# Patient Record
Sex: Female | Born: 1955 | Race: Black or African American | Hispanic: No | Marital: Single | Smoking: Never smoker
Health system: Southern US, Community
[De-identification: ages and names within clinical notes are randomized; demographics above are authoritative.]

## PROBLEM LIST (undated history)

## (undated) DIAGNOSIS — K449 Diaphragmatic hernia without obstruction or gangrene: Secondary | ICD-10-CM

## (undated) DIAGNOSIS — Z9989 Dependence on other enabling machines and devices: Secondary | ICD-10-CM

## (undated) DIAGNOSIS — K219 Gastro-esophageal reflux disease without esophagitis: Secondary | ICD-10-CM

## (undated) DIAGNOSIS — F1921 Other psychoactive substance dependence, in remission: Secondary | ICD-10-CM

## (undated) DIAGNOSIS — K635 Polyp of colon: Secondary | ICD-10-CM

## (undated) DIAGNOSIS — E039 Hypothyroidism, unspecified: Secondary | ICD-10-CM

## (undated) DIAGNOSIS — M47816 Spondylosis without myelopathy or radiculopathy, lumbar region: Secondary | ICD-10-CM

## (undated) DIAGNOSIS — G459 Transient cerebral ischemic attack, unspecified: Secondary | ICD-10-CM

## (undated) DIAGNOSIS — I10 Essential (primary) hypertension: Secondary | ICD-10-CM

## (undated) DIAGNOSIS — E7800 Pure hypercholesterolemia, unspecified: Secondary | ICD-10-CM

## (undated) DIAGNOSIS — Z8673 Personal history of transient ischemic attack (TIA), and cerebral infarction without residual deficits: Secondary | ICD-10-CM

## (undated) DIAGNOSIS — F1991 Other psychoactive substance use, unspecified, in remission: Secondary | ICD-10-CM

## (undated) DIAGNOSIS — G4733 Obstructive sleep apnea (adult) (pediatric): Secondary | ICD-10-CM

## (undated) DIAGNOSIS — Z87898 Personal history of other specified conditions: Secondary | ICD-10-CM

## (undated) HISTORY — PX: HIATAL HERNIA REPAIR: SHX195

## (undated) HISTORY — DX: Personal history of transient ischemic attack (TIA), and cerebral infarction without residual deficits: Z86.73

## (undated) HISTORY — DX: Spondylosis without myelopathy or radiculopathy, lumbar region: M47.816

## (undated) HISTORY — DX: Other psychoactive substance dependence, in remission: F19.21

## (undated) HISTORY — DX: Other psychoactive substance use, unspecified, in remission: F19.91

## (undated) HISTORY — DX: Essential (primary) hypertension: I10

## (undated) HISTORY — DX: Polyp of colon: K63.5

## (undated) HISTORY — DX: Diaphragmatic hernia without obstruction or gangrene: K44.9

## (undated) HISTORY — DX: Transient cerebral ischemic attack, unspecified: G45.9

## (undated) HISTORY — DX: Hypothyroidism, unspecified: E03.9

## (undated) HISTORY — DX: Personal history of other specified conditions: Z87.898

## (undated) HISTORY — DX: Obstructive sleep apnea (adult) (pediatric): G47.33

## (undated) HISTORY — DX: Dependence on other enabling machines and devices: Z99.89

## (undated) HISTORY — PX: CATARACT EXTRACTION: SUR2

## (undated) HISTORY — DX: Pure hypercholesterolemia, unspecified: E78.00

## (undated) HISTORY — DX: Gastro-esophageal reflux disease without esophagitis: K21.9

## (undated) HISTORY — PX: MINOR CARPAL TUNNEL: SHX6472

---

## 2007-03-16 ENCOUNTER — Ambulatory Visit: Payer: Self-pay | Admitting: Cardiology

## 2007-03-16 ENCOUNTER — Inpatient Hospital Stay (HOSPITAL_COMMUNITY): Admission: EM | Admit: 2007-03-16 | Discharge: 2007-03-17 | Payer: Self-pay | Admitting: Emergency Medicine

## 2007-03-26 ENCOUNTER — Ambulatory Visit: Payer: Self-pay | Admitting: Cardiology

## 2010-07-20 NOTE — H&P (Signed)
Erin Ferguson, CALLARI NO.:  [id number]   MEDICAL RECORD NO.:  [id number]          PATIENT TYPE:  EMS   LOCATION:  MAJO                         FACILITY:  [facility]   PHYSICIAN:  Reginia Forts, MD     DATE OF BIRTH:  1955/07/22   DATE OF ADMISSION:  03/16/2007  DATE OF DISCHARGE:                              HISTORY & PHYSICAL   ATTENDING PHYSICIAN:  Jonelle Sidle, M.D. with [facility].   CHIEF COMPLAINT:  Chest pain.   HISTORY OF PRESENT ILLNESS:  Erin Ferguson is a 55 year old Caucasian woman  with a history of atypical chest pain who presents with an episode of  substernal chest pressure at work today.  Historically, the patient  first had chest pain in 2007.  She was admitted to [facility] and  was ruled out for a myocardial infarction.  Cardiac cath demonstrated no  significant disease.  In addition, a GI workup also was negative.  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  In early  October, she developed substernal chest pressure with a band-like  radiation around the chest and back.  Her primary care physician started  her on muscle relaxants which did not relieve the symptoms.  She did  notice that when she was on temporary leave from her work, she had no  recurrence of symptoms.  She also denies any exertional component to her  chest pain.  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  She states that she had difficulty  keeping up with the production line.  Upon discontinuing her work, she  noted improvement in her symptoms.  She denies any orthopnea, paroxysmal  nocturnal dyspnea, or syncope.  She denies any dyspnea on exertion.  In  the emergency room, the patient was started on IV heparin, aspirin, and  nitroglycerin.  She currently  rates her pain as a 1 out of 10.   PAST MEDICAL HISTORY:  1. Obstructive sleep apnea on CPAP.  2. History of chest pain with cardiac cath in  2007, which was      reportedly normal.  3. Stable lung nodule.  4. Plantar fascitis.   PAST SURGICAL HISTORY:  Hysterectomy in 2005.   ALLERGIES:  No known drug allergies.   MEDICATIONS:  Naproxen 500 mg p.o. b.i.d. for plantar fascitis.   SOCIAL HISTORY:  The patient lives in [place].  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   REVIEW OF SYSTEMS:  Notable for occasional nosebleed usually upon  bearing down or with significantly dry air.  All the rest of 12 systems  were reviewed.   PHYSICAL EXAMINATION:  VITAL SIGNS:  Temperature is 97.2, pulse 73,  respiratory rate  16, blood pressure 159/74.  GENERAL:  The patient is awake, alert, oriented x3 in no acute distress.  HEENT:  Normocephalic atraumatic.  Pupils equal and round, reactive to  light.  Extraocular movements are intact.  Nasal septum appears mildly  inverted.  NECK:  Shows no JVD.  CARDIOVASCULAR:  Regular rhythm, normal rate.  No murmurs, rubs, or  gallops.  LUNGS:  Clear to auscultation bilaterally.  ABDOMEN:  Obese, positive bowel sounds, soft, nontender, nondistended.  EXTREMITIES:  Show no cyanosis, clubbing, or edema.  MUSCULOSKELETAL:  Demonstrate no joint deformities or effusions.  NEUROLOGIC:  Cranial nerves II-XII grossly intact.  No focal motor or  sensory deficits.  SKIN:  Demonstrates no rashes or lesions.  PSYCH:  Demonstrates normal affect.   X-RAY:  Chest x-ray demonstrates mild cardiomegaly and a dilated left  atrium with a mild right lower lobe atelectasis.   EKG is a normal sinus rhythm with a heart rate of 68, normal axis and  nonspecific ST-T wave changes.    LABORATORY:  Demonstrate a hemoglobin of 13.9.  BUN 20, creatinine 1.2.  Troponin is less than 0.05.  CK is 50.3.  MB is 1.2.   ASSESSMENT:  This is a 55 year old Caucasiancasian woman with atypical chest  pain which may be related to occupational stress.   PLAN:  1. Acute coronary syndrome.  The patient will be ruled out for      myocardial infarction.  We will continue intravenous heparin that      was started in the emergency room.  I anticipate a stress test if      the patient rules out.  2. Hypertension.  Blood pressure appears to be improving in the      emergency room.  We will continue to monitor.  Beta-blocker will be      held until a urine drug screen has been obtained.  Pending those      results, a beta-blocker may be added during the hospital course.      In the interim, the patient will be started on an ACE inhibitor.  3. Gastrointestinal.  The patient will be started on Protonix for a      possible gastrointestinal cause of her symptoms.      Reginia Forts, MD  Electronically Signed     RA/MEDQ  D:  03/16/2007  T:  03/16/2007  Job:  [id number]

## 2010-07-20 NOTE — Discharge Summary (Signed)
Erin Ferguson, Erin NO.:  [id number]   MEDICAL RECORD NO.:  [id number]          PATIENT TYPE:  INP   LOCATION:  3733                         FACILITY:  [facility]   PHYSICIAN:  Duke Salvia, MD, FACCDATE OF BIRTH:  Mar 22, 1955   DATE OF ADMISSION:  03/16/2007  DATE OF DISCHARGE:  03/17/2007                               DISCHARGE SUMMARY   PROCEDURE:  None.   PRIMARY FINAL DISCHARGE DIAGNOSIS:  1. Chest pain, cardiac enzymes negative for myocardial infarction and      follow up with cardiology as needed.   SECONDARY DIAGNOSES:  1. Obstructive sleep apnea on continuous positive airway pressure.  2. History of chest pain evaluation in 2007 reportedly by cardiac      catheterization which was normal.  3. History of lung nodule which was stable.  4. Plantar fasciitis.  5. Status post hysterectomy.  6. Remote history of drug use.  7. Reported history of increased stress at work and some anxiety      secondary to this.  8. Borderline hypertension with the patient reporting outpatient blood      pressures generally greater than or equal to 130, with blood      pressure of 141/88 on admission.  9. Dyslipidemia with a total cholesterol 185, triglycerides 44, HDL      38, LDL 138.  Dietary changes encouraged.   TIME SPENT AT DISCHARGE:  38 minutes.   HOSPITAL COURSE:  Erin Ferguson is a 55 year old female with no previous  history of coronary artery disease.  She had been evaluated by cath in  the past which was normal at [facility].  A GI workup at that time  was also negative.  She developed recurrent chest pain and came to the  emergency room where she was admitted for further evaluation.   Her cardiac enzymes were negative for MI.  Her pain could be brought on  by particular arm movements, and she stated it was associated with  stress.  She stated that she did not have any change in her pain with  the Protonix.   On March 17, 2007, Erin Ferguson was evaluated  by Dr. Graciela Husbands.  He felt that  her symptoms were atypical and she could be safely evaluated as an  outpatient by her family physician.  She is therefore discharged home on  March 17, 2007 and is to follow up with Dr. Archer Asa at the [facility].   DISCHARGE INSTRUCTIONS:  Her activity level is to be increased  gradually.  She is encouraged to stick to a low-sodium heart-healthy  diet.  She is to follow up with Dr. Diona Browner as needed.  She is to  follow up with the [facility] with Dr.  Archer Asa as soon as possible.   DISCHARGE MEDICATIONS:  Naproxen 500 mg b.i.d.      Theodore Demark, PA-C      Duke Salvia, MD, [facility]  Electronically Signed    RB/MEDQ  D:  03/17/2007  T:  03/18/2007  Job:  [id number]   cc:   Molero, M.D.

## 2010-11-25 LAB — I-STAT 8, (EC8 V) (CONVERTED LAB)
Chloride: 111
Glucose, Bld: 102 — ABNORMAL HIGH
HCT: 41
Potassium: 3.5
Sodium: 141
TCO2: 25

## 2010-11-25 LAB — POCT CARDIAC MARKERS
Myoglobin, poc: 50.3
Operator id: 234501
Troponin i, poc: <0.05

## 2010-11-25 LAB — COMPREHENSIVE METABOLIC PANEL
ALT: 17
AST: 17
Albumin: 3.4 — ABNORMAL LOW
Alkaline Phosphatase: 69
Potassium: 3.5
Sodium: 139
Total Protein: 6.3

## 2010-11-25 LAB — CBC
Hemoglobin: 13.1
RBC: 4.39

## 2010-11-25 LAB — LIPID PANEL
Total CHOL/HDL Ratio: 4.9
VLDL: 9

## 2010-11-25 LAB — TROPONIN I

## 2010-11-25 LAB — CARDIAC PANEL(CRET KIN+CKTOT+MB+TROPI): CK, MB: 1.6

## 2010-11-25 LAB — HEPARIN LEVEL (UNFRACTIONATED)

## 2010-11-25 LAB — POCT I-STAT CREATININE: Operator id: 234501

## 2013-08-17 DIAGNOSIS — I10 Essential (primary) hypertension: Secondary | ICD-10-CM | POA: Insufficient documentation

## 2013-08-17 DIAGNOSIS — E079 Disorder of thyroid, unspecified: Secondary | ICD-10-CM | POA: Insufficient documentation

## 2013-08-17 HISTORY — DX: Essential (primary) hypertension: I10

## 2014-10-20 DIAGNOSIS — G459 Transient cerebral ischemic attack, unspecified: Secondary | ICD-10-CM

## 2014-10-20 HISTORY — DX: Transient cerebral ischemic attack, unspecified: G45.9

## 2015-09-29 DIAGNOSIS — M47816 Spondylosis without myelopathy or radiculopathy, lumbar region: Secondary | ICD-10-CM

## 2015-09-29 HISTORY — DX: Spondylosis without myelopathy or radiculopathy, lumbar region: M47.816

## 2016-05-09 DIAGNOSIS — R079 Chest pain, unspecified: Secondary | ICD-10-CM | POA: Insufficient documentation

## 2016-05-09 DIAGNOSIS — K219 Gastro-esophageal reflux disease without esophagitis: Secondary | ICD-10-CM | POA: Insufficient documentation

## 2016-05-09 HISTORY — DX: Gastro-esophageal reflux disease without esophagitis: K21.9

## 2018-03-09 LAB — CBC AND DIFFERENTIAL
HCT: 44 (ref 36–46)
Hemoglobin: 14.8 (ref 12.0–16.0)
Platelets: 243 (ref 150–399)
WBC: 5.9

## 2018-03-30 ENCOUNTER — Encounter: Payer: Self-pay | Admitting: Osteopathic Medicine

## 2018-03-30 ENCOUNTER — Ambulatory Visit (INDEPENDENT_AMBULATORY_CARE_PROVIDER_SITE_OTHER): Payer: 59 | Admitting: Osteopathic Medicine

## 2018-03-30 VITALS — BP 139/69 | HR 77 | Temp 98.3°F | Ht 63.0 in | Wt 219.1 lb

## 2018-03-30 DIAGNOSIS — N2889 Other specified disorders of kidney and ureter: Secondary | ICD-10-CM | POA: Diagnosis not present

## 2018-03-30 DIAGNOSIS — G459 Transient cerebral ischemic attack, unspecified: Secondary | ICD-10-CM

## 2018-03-30 DIAGNOSIS — K219 Gastro-esophageal reflux disease without esophagitis: Secondary | ICD-10-CM

## 2018-03-30 DIAGNOSIS — G4733 Obstructive sleep apnea (adult) (pediatric): Secondary | ICD-10-CM

## 2018-03-30 DIAGNOSIS — E039 Hypothyroidism, unspecified: Secondary | ICD-10-CM

## 2018-03-30 DIAGNOSIS — I10 Essential (primary) hypertension: Secondary | ICD-10-CM | POA: Diagnosis not present

## 2018-03-30 DIAGNOSIS — K449 Diaphragmatic hernia without obstruction or gangrene: Secondary | ICD-10-CM

## 2018-03-30 DIAGNOSIS — Z9989 Dependence on other enabling machines and devices: Secondary | ICD-10-CM

## 2018-03-30 HISTORY — DX: Obstructive sleep apnea (adult) (pediatric): G47.33

## 2018-03-30 HISTORY — DX: Hypothyroidism, unspecified: E03.9

## 2018-03-30 HISTORY — DX: Diaphragmatic hernia without obstruction or gangrene: K44.9

## 2018-03-30 NOTE — Progress Notes (Signed)
HPI: Erin Ferguson is a 63 y.o. 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  [facility], 03/30/18,  for chief complaint of: New to establish care  Renal cyst  [place] [facility] [place] like to establish with a primary doctor outside of the system, this is what brings her to the office today.  Only concern is incidental finding of renal cyst on recent MRI  MRI of lumbar spine 02/20/2018 "Incidental findings: Partially visualized mildly complex left renal cyst with septation measures 6.4 cm"  Otherwise[facility] due to dyspnea on exertion and otherwise negative work-up except for abnormal stress test, no records are currently available  History of sleep apnea, compliant with and benefiting from CPAP.   History of hypertension: Controlled on hydrochlorothiazide  History of hypothyroidism, controlled on levothyroxine, not sure when last labs were checked.  History of GERD/hiatal hernia, recently changed to omeprazole from ranitidine, doing well on this.  History of TIA, on statin and low-dose aspirin.    Past medical, surgical, social and family history reviewed:  Patient Active Problem List   Diagnosis Date Noted  . Hypothyroidism 03/30/2018  . Hiatal hernia 03/30/2018  . OSA on CPAP 03/30/2018  . Chest pain 05/09/2016  . GERD (gastroesophageal reflux disease) 05/09/2016  . Spondylosis of lumbar region  without myelopathy or radiculopathy 09/29/2015  . TIA (transient ischemic attack) 10/20/2014  . Hypertension 08/17/2013    Past Surgical History:  Procedure Laterality Date  . CATARACT EXTRACTION    . HIATAL HERNIA REPAIR    . MINOR CARPAL TUNNEL      Social History   Tobacco Use  . Smoking status: Never Smoker  . Smokeless tobacco: Former Engineer, waterUser  Substance Use Topics  . Alcohol use: Yes    Comment: Once in a while    Family History  Problem Relation Age of Onset  . Heart disease Mother        hx rheumatic fever       Current medication list and allergy/intolerance information reviewed:    Current Outpatient Medications  Medication Sig Dispense Refill  . aspirin EC 81 MG tablet Take by mouth.    Marland Kitchen. atorvastatin (LIPITOR) 80 MG tablet Take by mouth.    . cetirizine (ZYRTEC) 10 MG tablet Take by mouth.    . cholecalciferol (VITAMIN D) 25 MCG (1000 UT) tablet Take by mouth.    . cyanocobalamin ([employer]S VITAMIN B12) 1000 MCG tablet Take by mouth.    . Flaxseed, Linseed, (FLAXSEED OIL) 1000 MG CAPS Take by mouth.    . hydrochlorothiazide (HYDRODIURIL) 25 MG tablet Take by mouth.    . levothyroxine (SYNTHROID, LEVOTHROID) 88 MCG tablet Take by mouth.    . Multiple Vitamins-Minerals (MULTIVITAMIN & MINERAL PO) Take by mouth.    . naproxen (NAPROSYN) 500 MG tablet Take by mouth.    Marland Kitchen. omeprazole (PRILOSEC) 10 MG capsule Take by mouth.    . ranitidine (ZANTAC) 150 MG tablet Take by mouth.     No current facility-administered medications for this visit.  Allergies  Allergen Reactions  . Sulfa Antibiotics Nausea Only and Nausea And Vomiting    Reports nausea, arm weakness and mood changes      Review of Systems:  Constitutional:  No  fever, no chills, No recent illness, +unintentional weight Gain. +significant fatigue.   HEENT: No  headache, no vision change, no hearing change, No sore throat, No  sinus pressure  Cardiac: No  chest pain, No  pressure, No palpitations,  No  Orthopnea  Respiratory:  +shortness of breath. No  Cough  Gastrointestinal: No  abdominal pain, No  nausea, No  vomiting,  No  blood in stool, No  diarrhea, No  constipation   Musculoskeletal: +myalgia/arthralgia  Skin: No  Rash, No other wounds/concerning lesions  Genitourinary: No  incontinence, No  abnormal genital bleeding, No abnormal genital discharge  Hem/Onc: No  easy bruising/bleeding, No  abnormal lymph node  Endocrine: No cold intolerance,  No heat intolerance. No polyuria/polydipsia/polyphagia   Neurologic: No  weakness, No  dizziness, No  slurred speech/focal weakness/facial droop  Psychiatric: No  concerns with depression, No  concerns with anxiety, +sleep problems, No mood problems  Exam:  BP 139/69 (BP Location: Left Arm, Patient Position: Sitting, Cuff Size: Large)   Pulse 77   Temp 98.3 F (36.8 C) (Oral)   Ht 5\' 3"  (1.6 m)   Wt 219 lb 1.6 oz (99.4 kg)   BMI 38.81 kg/m   Constitutional: VS see above. General Appearance: alert, well-developed, well-nourished, NAD  Eyes: Normal lids and conjunctive, non-icteric sclera  Ears, Nose, Mouth, Throat: MMM, Normal external inspection ears/nares/mouth/lips/gums. TM normal bilaterally. Pharynx/tonsils no erythema, no exudate. Nasal mucosa normal.   Neck: No masses, trachea midline. No thyroid enlargement. No tenderness/mass appreciated. No lymphadenopathy  Respiratory: Normal respiratory effort. no wheeze, no rhonchi, no rales  Cardiovascular: S1/S2 normal, no murmur, no rub/gallop auscultated. RRR. No lower extremity edema.   Gastrointestinal: Nontender, no masses. No hepatomegaly, no splenomegaly. No hernia appreciated. Bowel sounds normal. Rectal exam deferred.   Musculoskeletal: Gait normal. No clubbing/cyanosis of digits.   Neurological: Normal balance/coordination. No tremor. No cranial nerve deficit on limited exam.   Skin: warm, dry, intact. No rash/ulcer.   Psychiatric: Normal judgment/insight.  Normal mood and affect. Oriented x3.         ASSESSMENT/PLAN: The primary encounter diagnosis was Essential hypertension. Diagnoses of Other specified disorders of kidney and ureter, TIA (transient ischemic attack), Gastroesophageal reflux disease, esophagitis presence not specified, Hypothyroidism, unspecified type, Hiatal hernia, and OSA on CPAP were also pertinent to this visit.    Orders Placed This Encounter  Procedures  . CT RENAL ABD W/WO  . BASIC METABOLIC PANEL WITH GFR      Patient Instructions  For renal mass: We will get some blood work today to confirm normal kidney function, and assuming this is normal we will get you set up for a CT scan of the kidneys with IV contrast.  Once this test is done and the radiologist has given their report, we will have a better idea of next steps, whether this is something we need to monitor or whether there is some intervention needed such as biopsy or surgery.  Anytime you go to the [place] for care, please let them know to send records to my office.  Our fax number is [phone].  You are at the [place] later today, please have them send at least your most recent lab results and any other relevant notes.  Visit summary with medication list and pertinent instructions was printed for patient to review. All questions at time of visit were answered - patient instructed to contact office with any additional concerns or updates. ER/RTC precautions were reviewed with the patient.    Please note: voice recognition software was used to produce this document, and typos may escape review. Please contact Dr. Lyn Hollingshead for any needed clarifications.     Follow-up plan: Return for recheck depending on CT results .

## 2018-03-30 NOTE — Patient Instructions (Signed)
For renal mass: We will get some blood work today to confirm normal kidney function, and assuming this is normal we will get you set up for a CT scan of the kidneys with IV contrast.  Once this test is done and the radiologist has given their report, we will have a better idea of next steps, whether this is something we need to monitor or whether there is some intervention needed such as biopsy or surgery.  Anytime you go to the [place] for care, please let them know to send records to my office.  Our fax number is [phone].  You are at the [place] later today, please have them send at least your most recent lab results and any other relevant notes.

## 2018-04-09 ENCOUNTER — Telehealth: Payer: Self-pay | Admitting: Osteopathic Medicine

## 2018-04-09 DIAGNOSIS — I10 Essential (primary) hypertension: Secondary | ICD-10-CM

## 2018-04-09 NOTE — Addendum Note (Signed)
Addended by: Sandrea Matte on: 04/09/2018 04:22 PM   Modules accepted: Orders

## 2018-04-09 NOTE — Addendum Note (Signed)
Addended by: Sandrea MatteERSKINE, Jazon Jipson R on: 04/09/2018 04:21 PM   Modules accepted: Orders

## 2018-04-09 NOTE — Telephone Encounter (Signed)
Patient has order - [facility] so she needs to get the LAB RESULTS or just repeat the blood work here.

## 2018-04-09 NOTE — Telephone Encounter (Signed)
Pt reports having labs done recently, CMP required for imaging. Routing.

## 2018-04-09 NOTE — Telephone Encounter (Signed)
New STAT lab order placed. Pt will have to get labs redone. She has been advised.

## 2018-04-13 ENCOUNTER — Other Ambulatory Visit: Payer: 59

## 2018-04-20 ENCOUNTER — Other Ambulatory Visit: Payer: 59

## 2018-04-27 ENCOUNTER — Telehealth: Payer: Self-pay | Admitting: Osteopathic Medicine

## 2018-04-27 ENCOUNTER — Ambulatory Visit (INDEPENDENT_AMBULATORY_CARE_PROVIDER_SITE_OTHER): Payer: 59

## 2018-04-27 DIAGNOSIS — I700 Atherosclerosis of aorta: Secondary | ICD-10-CM | POA: Diagnosis not present

## 2018-04-27 DIAGNOSIS — N2889 Other specified disorders of kidney and ureter: Secondary | ICD-10-CM | POA: Diagnosis not present

## 2018-04-27 MED ORDER — IOPAMIDOL (ISOVUE-370) INJECTION 76%
100.0000 mL | Freq: Once | INTRAVENOUS | Status: AC | PRN
  Administered 2018-04-27: 100 mL via INTRAVENOUS

## 2018-04-27 NOTE — Telephone Encounter (Signed)
I had the front staff([place]) have the patient sign a records release so we can get complete records to review for this patient.

## 2018-04-27 NOTE — Telephone Encounter (Signed)
Pt stopped by inquiring about records I got some from the [place] but just old imaging results, nothing comprehensive

## 2018-04-30 LAB — CBC AND DIFFERENTIAL
HCT: 44 (ref 36–46)
Hemoglobin: 14.8 (ref 12.0–16.0)
Platelets: 243 (ref 150–399)
WBC: 5.9

## 2018-06-06 ENCOUNTER — Encounter: Payer: Self-pay | Admitting: Osteopathic Medicine

## 2018-06-06 DIAGNOSIS — R911 Solitary pulmonary nodule: Secondary | ICD-10-CM | POA: Insufficient documentation

## 2018-06-21 ENCOUNTER — Other Ambulatory Visit: Payer: Self-pay | Admitting: Osteopathic Medicine

## 2018-06-21 ENCOUNTER — Encounter: Payer: Self-pay | Admitting: Osteopathic Medicine

## 2019-04-08 ENCOUNTER — Telehealth: Payer: Self-pay | Admitting: Osteopathic Medicine

## 2019-04-08 DIAGNOSIS — N281 Cyst of kidney, acquired: Secondary | ICD-10-CM

## 2019-04-08 NOTE — Telephone Encounter (Signed)
Please call patient: She is due for follow-up renal CT to monitor the cyst on the left kidney.  I have placed order for the imaging and for BMP if needed.

## 2019-04-08 NOTE — Telephone Encounter (Addendum)
-----   Message from Sunnie Nielsen, DO sent at 05/01/2018  7:14 AM EST ----- Regarding: f/u CT renal Due for repeat kidney imaging CT w/wo for L renal cyst

## 2019-04-09 NOTE — Telephone Encounter (Signed)
Spoke with patient, she is aware she needs to get her CT.  She is off next week and would like to be scheduled next week after we determine if we need to do a prior authorization.

## 2019-04-15 LAB — BASIC METABOLIC PANEL WITH GFR
BUN: 15 mg/dL (ref 7–25)
CO2: 31 mmol/L (ref 20–32)
Calcium: 9.2 mg/dL (ref 8.6–10.4)
Chloride: 107 mmol/L (ref 98–110)
Creat: 0.85 mg/dL (ref 0.50–0.99)
GFR, Est African American: 84 mL/min/{1.73_m2} (ref >=60)
GFR, Est Non African American: 72 mL/min/{1.73_m2} (ref >=60)
Glucose, Bld: 120 mg/dL — ABNORMAL HIGH (ref 65–99)
Potassium: 4.2 mmol/L (ref 3.5–5.3)
Sodium: 144 mmol/L (ref 135–146)

## 2019-04-16 ENCOUNTER — Ambulatory Visit (INDEPENDENT_AMBULATORY_CARE_PROVIDER_SITE_OTHER): Payer: 59

## 2019-04-16 ENCOUNTER — Other Ambulatory Visit: Payer: Self-pay

## 2019-04-16 DIAGNOSIS — N281 Cyst of kidney, acquired: Secondary | ICD-10-CM

## 2019-04-16 MED ORDER — IOHEXOL 350 MG/ML SOLN
100.0000 mL | Freq: Once | INTRAVENOUS | Status: AC | PRN
  Administered 2019-04-16: 09:00:00 100 mL via INTRAVENOUS

## 2020-09-09 IMAGING — CT CT ABDOMEN WO/W CM
2 of 10 series · 11 of 46 positions shown, 17 images · IV contrast (omnipaque)
Comparison: 04/27/2018

CLINICAL DATA: Left renal cyst.

EXAM:
CT ABDOMEN WITHOUT AND WITH CONTRAST
TECHNIQUE: Multidetector CT imaging of the abdomen was performed following the
standard protocol before and following the bolus administration of
intravenous contrast.
CONTRAST:  100mL OMNIPAQUE IOHEXOL 350 MG/ML SOLN

[Series 3: coronal pre · coronal · non-contrast · 0.54mm/px · 2 of 108 slices shown]
[im 36/108  soft-tissue]
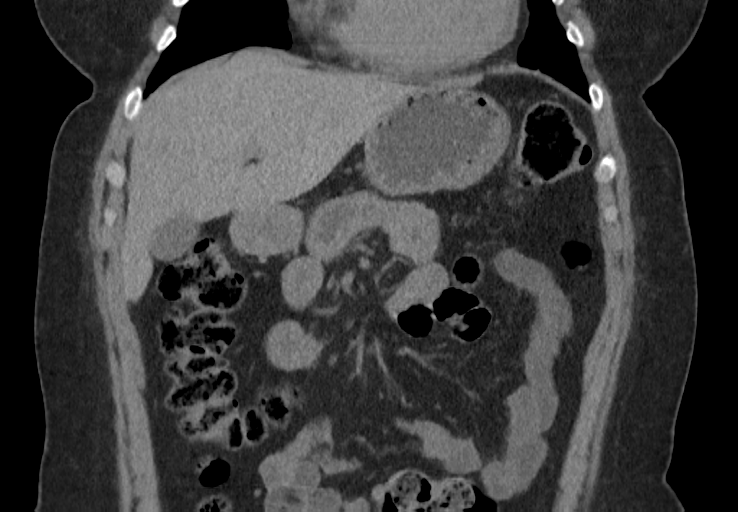
[im 72/108  soft-tissue]
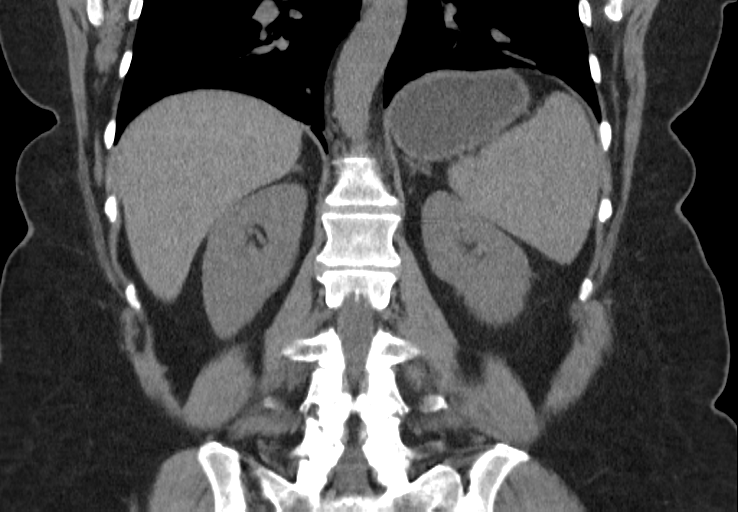

[Series 5: axial arterial · axial · arterial · 0.89mm/px · z∈[-308,-88]mm · 9 of 93 slices shown, 15 images]
[im 10/93  soft-tissue]
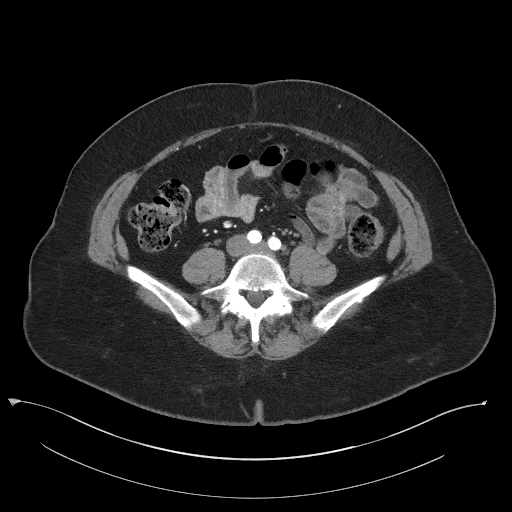
[im 10/93  bone]
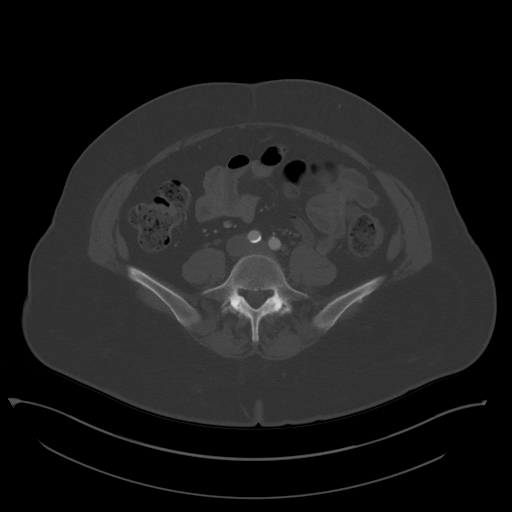
[im 19/93  soft-tissue]
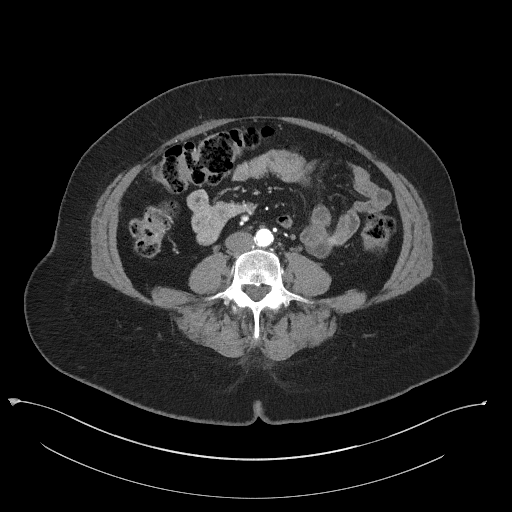
[im 28/93  soft-tissue]
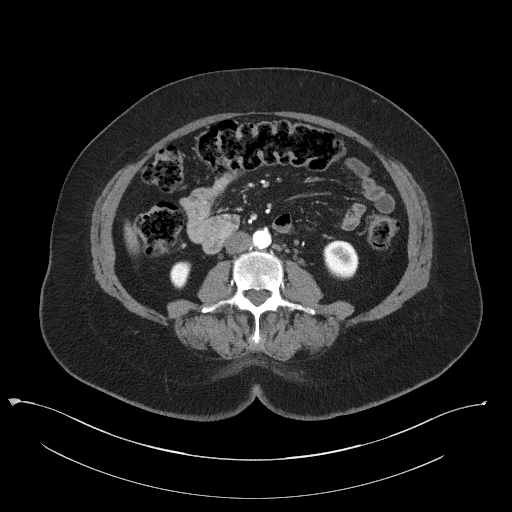
[im 37/93  soft-tissue]
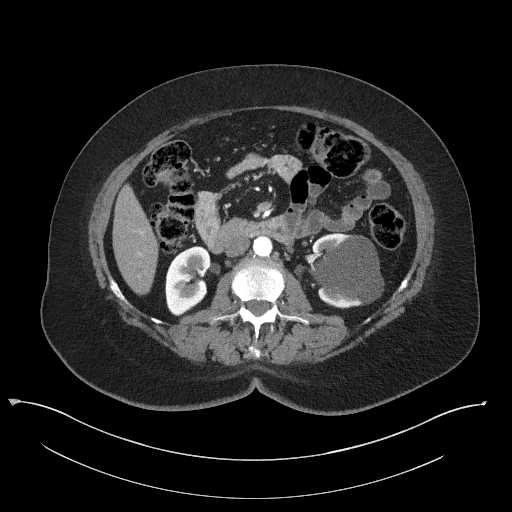
[im 47/93  soft-tissue]
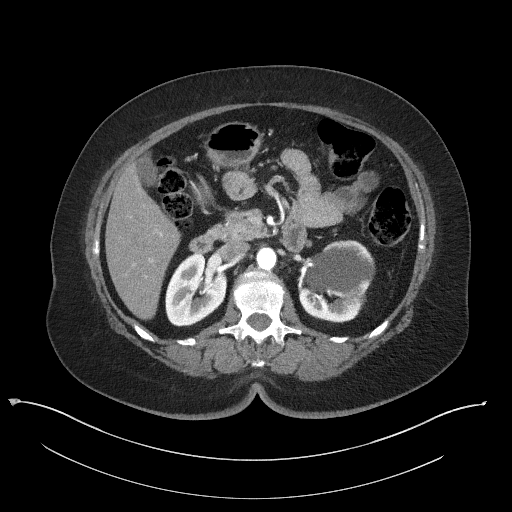
[im 56/93  soft-tissue]
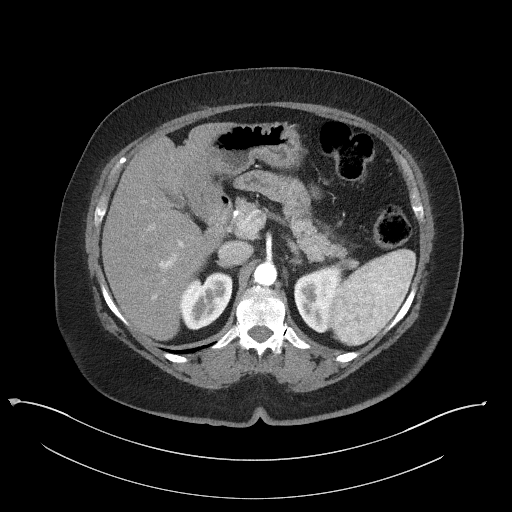
[im 56/93  lung]
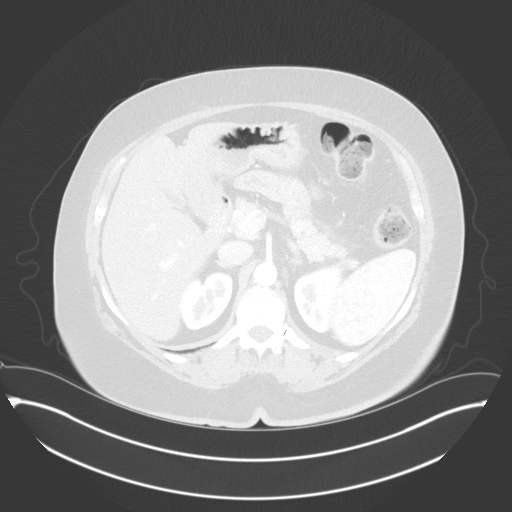
[im 65/93  soft-tissue]
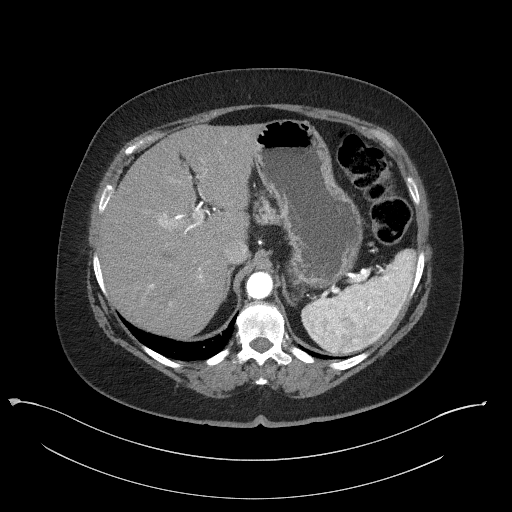
[im 65/93  lung]
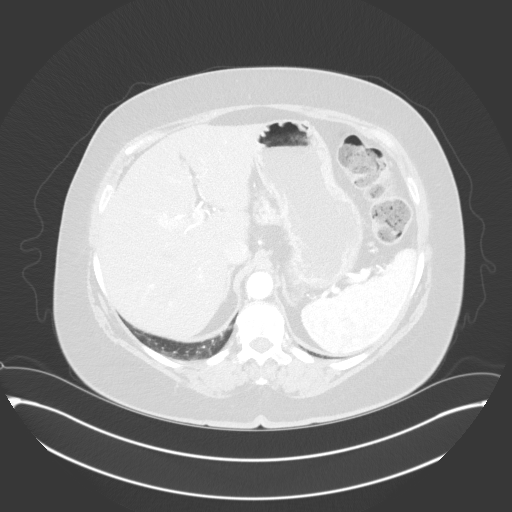
[im 74/93  soft-tissue]
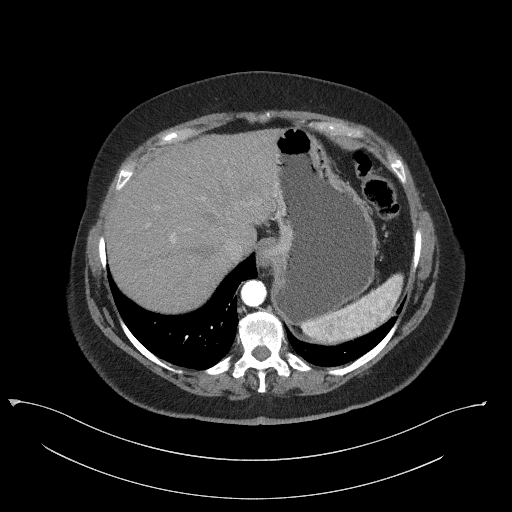
[im 74/93  lung]
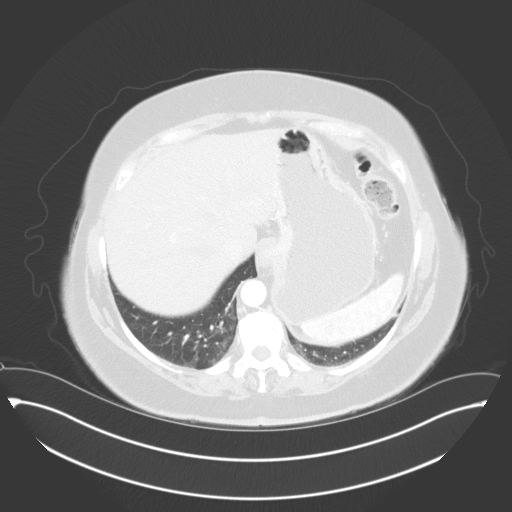
[im 83/93  soft-tissue]
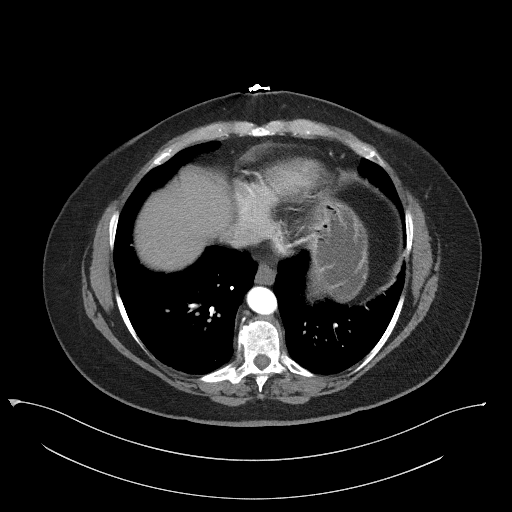
[im 83/93  lung]
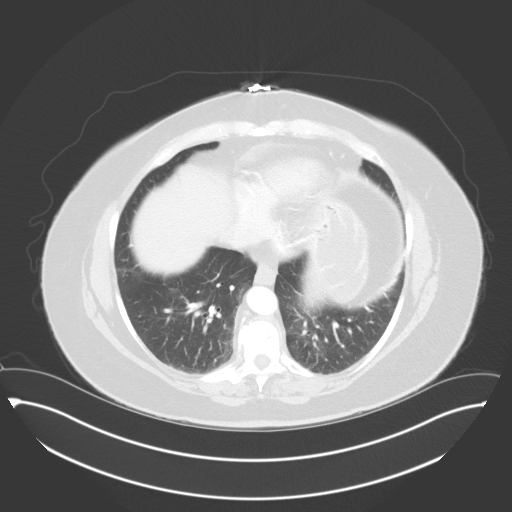
[im 83/93  bone]
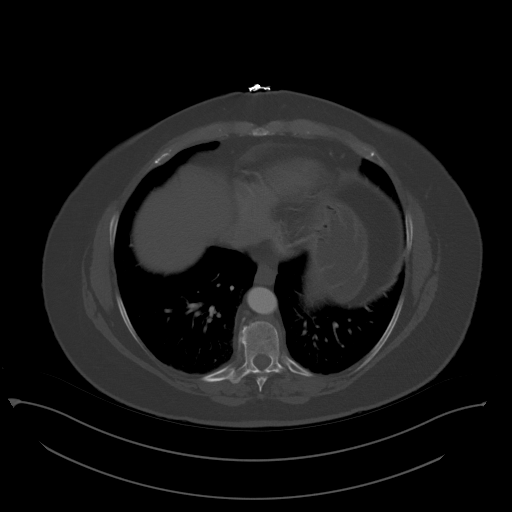

[11 of 46 positions shown; findings below may reference images not displayed]

FINDINGS: Lower chest: 6 mm posterior right lower lobe pulmonary nodule is
stable in the interval.

Hepatobiliary: 7 mm hypoattenuating lesion inferior right liver is
stable. Given the interval stability, this is likely a cyst. There
is no evidence for gallstones, gallbladder wall thickening, or
pericholecystic fluid. No intrahepatic or extrahepatic biliary
dilation.

Pancreas: No focal mass lesion. No dilatation of the main duct. No
intraparenchymal cyst. No peripancreatic edema.

Spleen: No splenomegaly. No focal mass lesion.

Adrenals/Urinary Tract: No adrenal nodule or mass. 6 mm hypodensity
in the interpolar right kidney is too small to characterize but
stable since prior. 7.2 x 5.5 cm lesion in the interpolar left
kidney is not substantially changed from 7.0 x 5.4 cm previously. As
on the prior exam, there is evidence of a thin internal septation
posteriorly. No definite mural thickening or mural nodularity.
Adjacent 13 mm subcapsular cyst is un changed.

Delayed imaging shows no filling defect or wall thickening in either
intrarenal collecting system. There is some mass-effect on the left
intrarenal collecting system from the complex cyst.

Stomach/Bowel: Stomach is unremarkable. No gastric wall thickening.
No evidence of outlet obstruction. Duodenum is normally positioned
as is the ligament of Treitz. No small bowel wall thickening. No
small bowel or colonic dilatation within the visualized abdomen.

Vascular/Lymphatic: There is abdominal aortic atherosclerosis
without aneurysm. There is no gastrohepatic or hepatoduodenal
ligament lymphadenopathy. No retroperitoneal or mesenteric
lymphadenopathy.

Other: No intraperitoneal free fluid.

Musculoskeletal: No worrisome lytic or sclerotic osseous
abnormality.
IMPRESSION: 1. No substantial interval change in the 7.2 cm complex cyst in the
interpolar left kidney with evidence of thin internal septation. The
equivocal enhancement along the lesion described previously is not
evident on today's study. Imaging features today are most compatible
with a Bosniak II cyst.
2. 6 mm hypodensity in the interpolar right kidney has attenuation
too high to be a simple cyst but is stable in size in the interval.
This may be a cyst complicated by proteinaceous debris or
hemorrhage. 1 year stability is reassuring. Repeat CT scan in 12
months could be used to confirm stability. MRI abdomen with and
without contrast may provide more definitive characterization as
clinically warranted.
3. Stable 6 mm posterior right lower lobe pulmonary nodule in the 1
year interval since prior study, suggesting benign etiology. 1
additional follow-up CT at 12 months (from today's scan) is
considered optional for low-risk patients, but is recommended for
high-risk patients. This recommendation follows the consensus
statement: Guidelines for Management of Incidental Pulmonary Nodules
Detected on CT Images: From the [HOSPITAL] 9215; Radiology
4. Aortic Atherosclerosis (TCRRY-ZIZ.Z).
